# Patient Record
Sex: Male | Born: 1940 | Race: White | Hispanic: No | Marital: Married
Health system: Southern US, Community
[De-identification: ages and names within clinical notes are randomized; demographics above are authoritative.]

---

## 2015-11-30 ENCOUNTER — Other Ambulatory Visit: Payer: Self-pay | Admitting: Nurse Practitioner

## 2015-11-30 DIAGNOSIS — R972 Elevated prostate specific antigen [PSA]: Secondary | ICD-10-CM

## 2015-12-14 ENCOUNTER — Inpatient Hospital Stay: Admission: RE | Admit: 2015-12-14 | Payer: Self-pay | Source: Ambulatory Visit

## 2015-12-15 ENCOUNTER — Other Ambulatory Visit (HOSPITAL_COMMUNITY): Payer: Self-pay | Admitting: Nurse Practitioner

## 2015-12-15 DIAGNOSIS — R972 Elevated prostate specific antigen [PSA]: Secondary | ICD-10-CM

## 2015-12-22 ENCOUNTER — Encounter (HOSPITAL_COMMUNITY)
Admission: RE | Admit: 2015-12-22 | Discharge: 2015-12-22 | Disposition: A | Discharge status: Home / Self Care | Payer: Non-veteran care | Source: Ambulatory Visit | Attending: Nurse Practitioner | Admitting: Nurse Practitioner

## 2015-12-22 DIAGNOSIS — Z029 Encounter for administrative examinations, unspecified: Secondary | ICD-10-CM | POA: Diagnosis present

## 2015-12-22 LAB — GLUCOSE, CAPILLARY: Glucose-Capillary: 82 mg/dL (ref 65–99)

## 2015-12-22 NOTE — Pre-Procedure Instructions (Signed)
    Desma MaximWilliam Penick  12/22/2015      [id number] [place] - [address] [place] [id number] Phone: [phone] Fax: [phone]    Your procedure is scheduled on 12-28-2015  Tuesday    [facility] at 8:00 A.M.   Call this number if you have problems the morning of surgery:  [phone]   Remember:  Do not eat food or drink liquids after midnight.   Take these medicines the morning of surgery with A SIP OF WATER Gabapentin(Neurotin)             STOP ALL OVER THR COUNTER SUPPLEMENTS AND VITAMINS 5 DAYS PRIOR TO PROCEDURE            DO NOT TAKE DIABETES MEDICATION THE MORNING OF YOUR PROCEDURE   Do not wear jewelry,  Do not wear lotions, powders, or perfumes.  You may NOT wear deodorant.  Do not shave 48 hours prior to surgery.  Men may shave face and neck.   Do not bring valuables to the hospital.  [facility] is not responsible for any belongings or valuables.  Contacts, dentures or bridgework may not be worn into surgery.  Leave your suitcase in the car.  After surgery it may be brought to your room.  For patients admitted to the hospital, discharge time will be determined by your treatment team.  Patients discharged the day of surgery will not be allowed to drive home.

## 2015-12-28 ENCOUNTER — Ambulatory Visit (HOSPITAL_COMMUNITY): Payer: Self-pay

## 2015-12-28 ENCOUNTER — Ambulatory Visit: Admit: 2015-12-28 | Payer: Self-pay

## 2015-12-28 SURGERY — RADIOLOGY WITH ANESTHESIA
Anesthesia: General

## 2016-01-03 ENCOUNTER — Ambulatory Visit (HOSPITAL_COMMUNITY)
Admission: RE | Admit: 2016-01-03 | Discharge: 2016-01-03 | Disposition: A | Discharge status: Home / Self Care | Payer: No Typology Code available for payment source | Source: Ambulatory Visit | Attending: Nurse Practitioner | Admitting: Nurse Practitioner

## 2016-01-03 DIAGNOSIS — N400 Enlarged prostate without lower urinary tract symptoms: Secondary | ICD-10-CM | POA: Insufficient documentation

## 2016-01-03 DIAGNOSIS — R972 Elevated prostate specific antigen [PSA]: Secondary | ICD-10-CM | POA: Insufficient documentation

## 2016-01-03 DIAGNOSIS — R590 Localized enlarged lymph nodes: Secondary | ICD-10-CM | POA: Diagnosis not present

## 2016-01-03 LAB — CREATININE, SERUM
Creatinine, Ser: 0.96 mg/dL (ref 0.61–1.24)
GFR calc non Af Amer: >60 mL/min (ref >60)

## 2016-01-03 MED ORDER — GADOBENATE DIMEGLUMINE 529 MG/ML IV SOLN
20.0000 mL | Freq: Once | INTRAVENOUS | Status: AC | PRN
  Administered 2016-01-03: 20 mL via INTRAVENOUS

## 2017-05-07 IMAGING — MR MR PROSTATE WO/W CM
30 of 56 series · 30 of 56 positions shown · IV contrast (20    MH)
Comparison: None.

CLINICAL DATA: Elevated PSA, negative prostate biopsy,
lymphadenopathy with history of non-Hodgkin's lymphoma

EXAM:
MR PROSTATE WITHOUT AND WITH CONTRAST
TECHNIQUE: Multiplanar multisequence MRI images were obtained of the pelvis
centered about the prostate. Pre and post contrast images were
obtained.
CONTRAST:  20mL MULTIHANCE GADOBENATE DIMEGLUMINE 529 MG/ML IV SOLN

[Series 3: T1 · axial · 8.0mm · 0.70mm/px · 1 of 26 slices shown (1 of 2)]
[im 1/26]
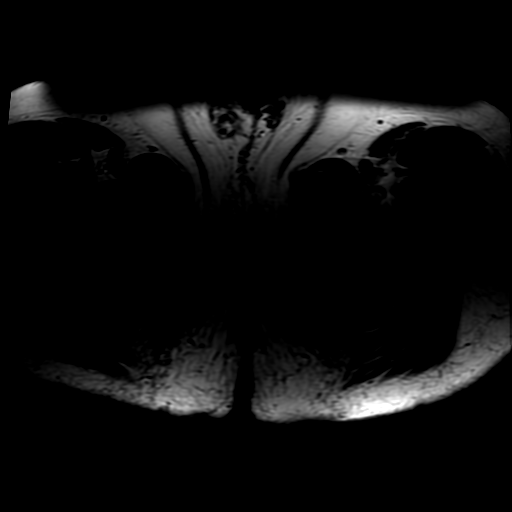

[Series 4: bSSFP · axial · 8.0mm · 0.70mm/px · 1 of 26 slices shown]
[im 1/26]
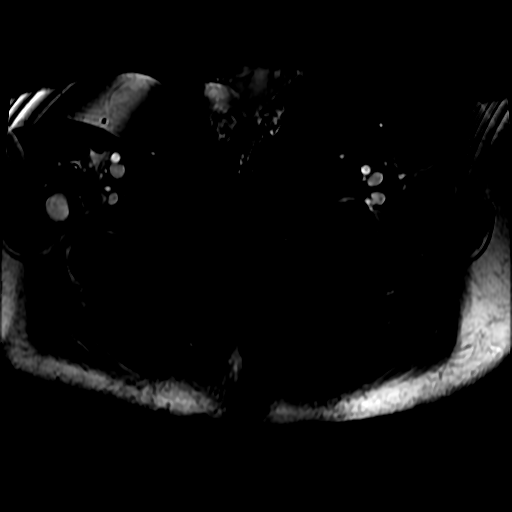

[Series 5: T2 · sagittal · 3.5mm · 0.35mm/px · 1 of 32 slices shown (1 of 3)]
[im 1/32]
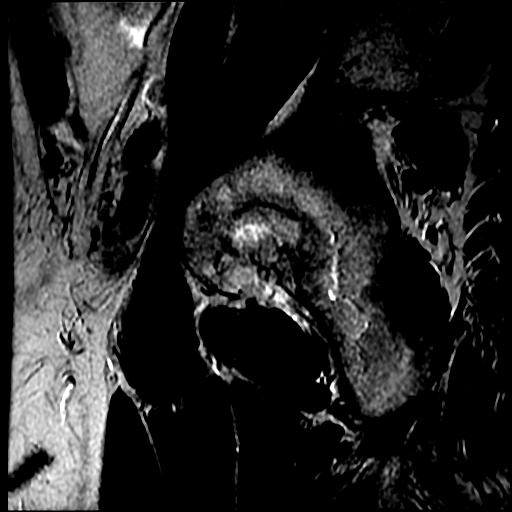

[Series 6: T1 · axial · 3.5mm · 0.35mm/px · 1 of 30 slices shown (2 of 2)]
[im 1/30]
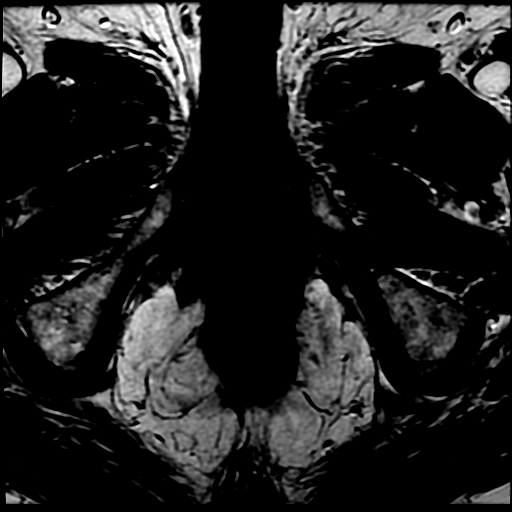

[Series 7: T2 · axial · 3.5mm · 0.35mm/px · 1 of 30 slices shown (2 of 3)]
[im 1/30]
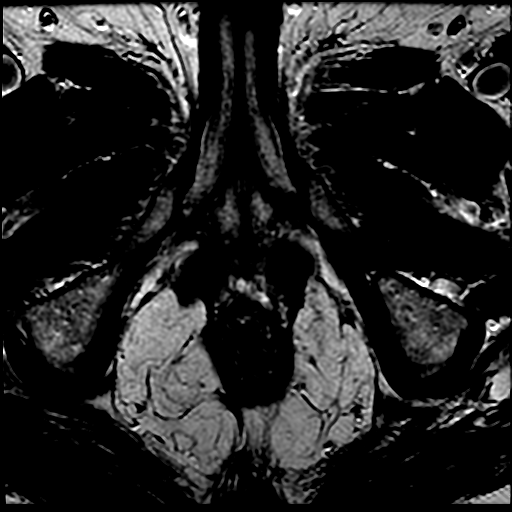

[Series 8: T2 · coronal · 3.5mm · 0.35mm/px · 1 of 30 slices shown (3 of 3)]
[im 1/30]
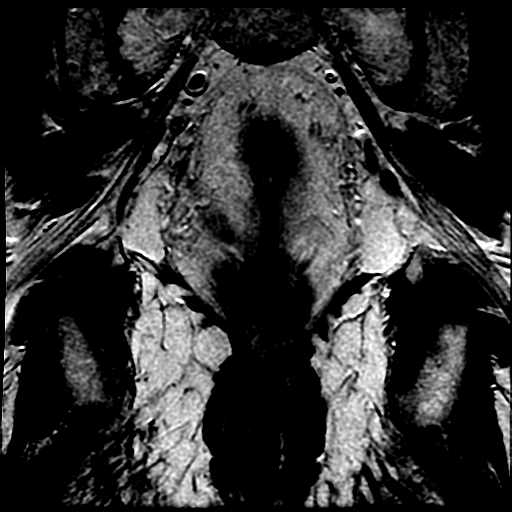

[Series 9: DWI · axial · 3.5mm · 0.86mm/px · 1 of 48 slices shown (1 of 8)]
[im 1/48]
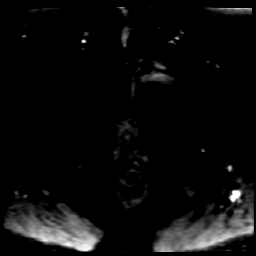

[Series 10: DWI · axial · 3.5mm · 0.86mm/px · 1 of 48 slices shown (2 of 8)]
[im 1/48]
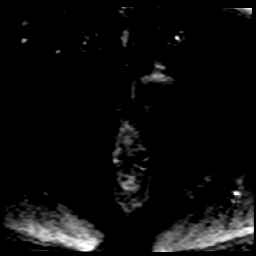

[Series 11: DWI · axial · 3.5mm · 0.86mm/px · 1 of 48 slices shown (3 of 8)]
[im 1/48]
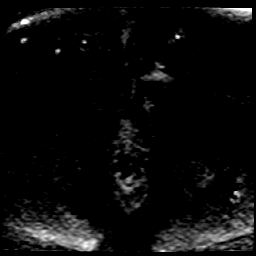

[Series 12: DWI · axial · 3.5mm · 0.86mm/px · 1 of 48 slices shown (4 of 8)]
[im 1/48]
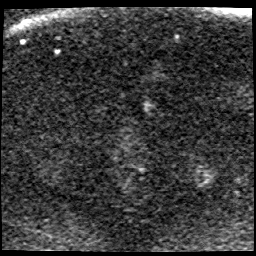

[Series 900: DWI · axial · 3.5mm · 0.86mm/px · 1 of 24 slices shown (5 of 8)]
[im 1/24]
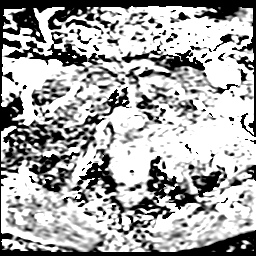

[Series 1000: DWI · axial · 3.5mm · 0.86mm/px · 1 of 24 slices shown (6 of 8)]
[im 1/24]
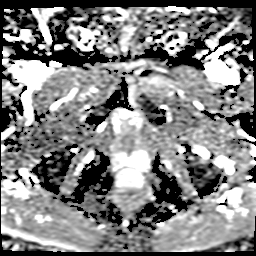

[Series 1100: DWI · axial · 3.5mm · 0.86mm/px · 1 of 18 slices shown (7 of 8)]
[im 1/18]
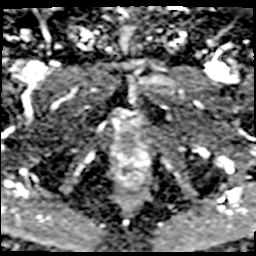

[Series 1200: DWI · axial · 3.5mm · 0.86mm/px · 1 of 24 slices shown (8 of 8)]
[im 1/24]
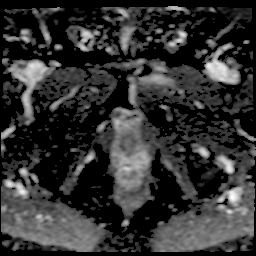

[((id)/(id)/1)-((id)/(id)/1) · axial · 4.0mm · 0.94mm/px · 1 of 16 slices shown (1 of 16)]
[im 1/16]
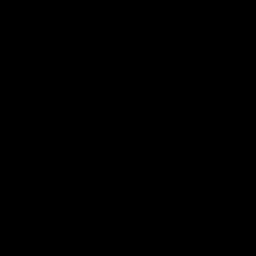

[((id)/(id)/1)-((id)/(id)/1) · axial · 4.0mm · 0.94mm/px · 1 of 3 slices shown (2 of 16)]
[im 1/3]
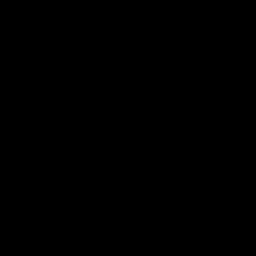

[((id)/(id)/1)-((id)/(id)/1) · axial · 4.0mm · 0.94mm/px · 1 of 28 slices shown (3 of 16)]
[im 1/28]
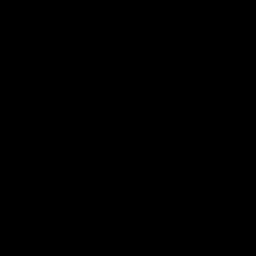

[((id)/(id)/1)-((id)/(id)/1) · axial · 4.0mm · 0.94mm/px · 1 of 28 slices shown (4 of 16)]
[im 1/28]
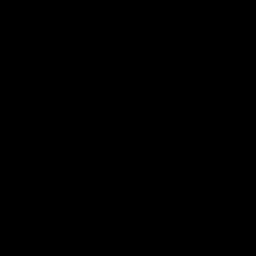

[((id)/(id)/1)-((id)/(id)/1) · axial · 4.0mm · 0.94mm/px · 1 of 28 slices shown (5 of 16)]
[im 1/28]
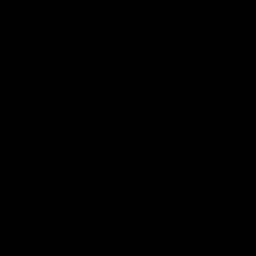

[((id)/(id)/1)-((id)/(id)/1) · axial · 4.0mm · 0.94mm/px · 1 of 28 slices shown (6 of 16)]
[im 1/28]
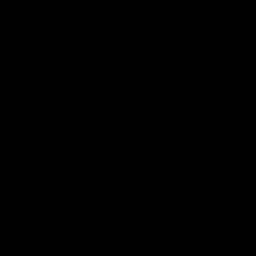

[((id)/(id)/1)-((id)/(id)/1) · axial · 4.0mm · 0.94mm/px · 1 of 28 slices shown (7 of 16)]
[im 1/28]
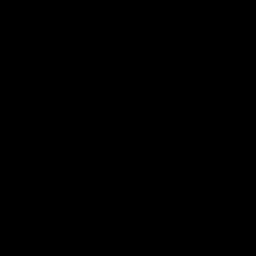

[((id)/(id)/1)-((id)/(id)/1) · axial · 4.0mm · 0.94mm/px · 1 of 28 slices shown (8 of 16)]
[im 1/28]
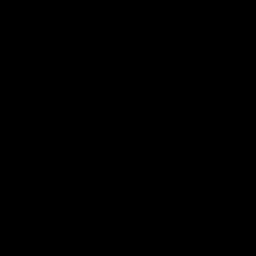

[((id)/(id)/1)-((id)/(id)/1) · axial · 4.0mm · 0.94mm/px · 1 of 28 slices shown (9 of 16)]
[im 1/28]
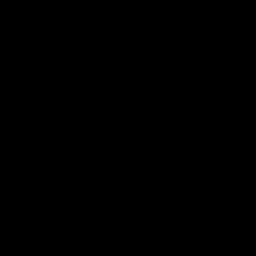

[((id)/(id)/1)-((id)/(id)/1) · axial · 4.0mm · 0.94mm/px · 1 of 28 slices shown (10 of 16)]
[im 1/28]
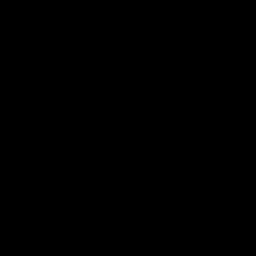

[((id)/(id)/1)-((id)/(id)/1) · axial · 4.0mm · 0.94mm/px · 1 of 28 slices shown (11 of 16)]
[im 1/28]
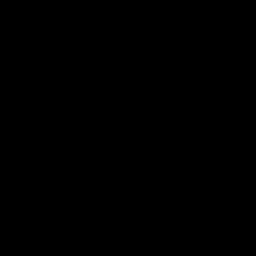

[((id)/(id)/1)-((id)/(id)/1) · axial · 4.0mm · 0.94mm/px · 1 of 28 slices shown (12 of 16)]
[im 1/28]
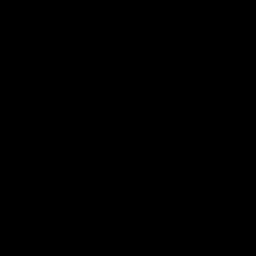

[((id)/(id)/1)-((id)/(id)/1) · axial · 4.0mm · 0.94mm/px · 1 of 28 slices shown (13 of 16)]
[im 1/28]
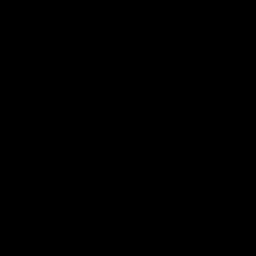

[((id)/(id)/1)-((id)/(id)/1) · axial · 4.0mm · 0.94mm/px · 1 of 28 slices shown (14 of 16)]
[im 1/28]
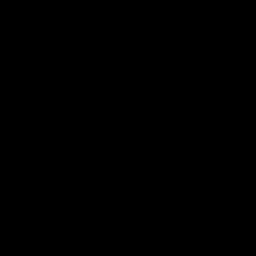

[((id)/(id)/1)-((id)/(id)/1) · axial · 4.0mm · 0.94mm/px · 1 of 28 slices shown (15 of 16)]
[im 1/28]
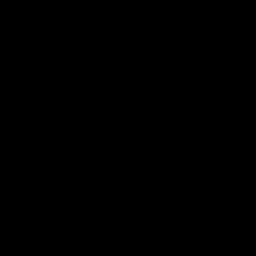

[((id)/(id)/1)-((id)/(id)/1) · axial · 4.0mm · 0.94mm/px · 1 of 28 slices shown (16 of 16)]
[im 1/28]
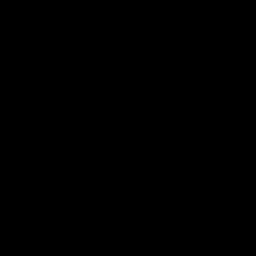

[30 of 56 positions shown; findings below may reference images not displayed]

FINDINGS: Prostate: Marked thinning of the peripheral gland. No definite focal
T2 lesion.

No findings to suggest high-grade macroscopic prostate cancer.
Specifically, no early arterial enhancement or restricted diffusion.

Marked enlargement/nodularity of the central gland, which indents
the base of the bladder, compatible with BPH. No suspicious
nodularity on T2.

Transcapsular spread:  Absent.

Seminal vesicle involvement: Absent.

Neurovascular bundle involvement: Absent.

Pelvic adenopathy: Bilateral pelvic lymphadenopathy, including a 12
mm short axis right pelvic sidewall node (series 7/ image 1), a 14
mm short axis left pelvic sidewall node (series 7/ image 5), and a
16 mm short axis left inguinal node (series 7/image 13).

Bone metastasis: Degenerative changes at the pubic symphysis with
suspected fluid, suggesting osteitis pubis.

However, there is mild focal/rounded enhancement in the left
parasymphyseal region (series 2162/image 18) with an associated T2
lesion (series 4/image 10), possibly reflecting lymphomatous
involvement. Subtle linear low T1 signal in this region on coronal
T1 imaging (series 8/image 29), pathologic fracture not excluded.

Other findings: None.
IMPRESSION: Marked thinning of the peripheral gland. No findings to suggest
macroscopic prostate cancer on MRI.

Marked enlargement/nodularity of the central gland, compatible with
BPH.

Bilateral pelvic lymphadenopathy, possibly related to lymphoma given
the clinical history.

Suspected enhancing lesion in the left parasymphyseal region,
possibly reflecting lymphomatous involvement. Consider dedicated [HOSPITAL]
protocol MRI pelvis with/without contrast for further evaluation.
Pathologic fracture is not excluded.
